# Patient Record
Sex: Male | Born: 1965 | Race: White | Hispanic: No | Marital: Married | Smoking: Never smoker
Health system: Southern US, Community
[De-identification: ages and names within clinical notes are randomized; demographics above are authoritative.]

## PROBLEM LIST (undated history)

## (undated) DIAGNOSIS — K432 Incisional hernia without obstruction or gangrene: Secondary | ICD-10-CM

## (undated) DIAGNOSIS — R319 Hematuria, unspecified: Secondary | ICD-10-CM

---

## 2011-01-11 ENCOUNTER — Emergency Department (HOSPITAL_COMMUNITY)
Admission: EM | Admit: 2011-01-11 | Discharge: 2011-01-11 | Discharge status: Against Medical Advice | Payer: PRIVATE HEALTH INSURANCE | Attending: Emergency Medicine | Admitting: Emergency Medicine

## 2011-01-11 DIAGNOSIS — I10 Essential (primary) hypertension: Secondary | ICD-10-CM | POA: Insufficient documentation

## 2011-01-11 DIAGNOSIS — IMO0002 Reserved for concepts with insufficient information to code with codable children: Secondary | ICD-10-CM | POA: Insufficient documentation

## 2011-01-11 DIAGNOSIS — F341 Dysthymic disorder: Secondary | ICD-10-CM | POA: Insufficient documentation

## 2011-01-11 DIAGNOSIS — Z79899 Other long term (current) drug therapy: Secondary | ICD-10-CM | POA: Insufficient documentation

## 2011-01-11 LAB — DIFFERENTIAL
Basophils Absolute: 0.1 10*3/uL (ref 0.0–0.1)
Basophils Relative: 1 % (ref 0–1)
Neutro Abs: 5.2 10*3/uL (ref 1.7–7.7)
Neutrophils Relative %: 63 % (ref 43–77)

## 2011-01-11 LAB — RAPID URINE DRUG SCREEN, HOSP PERFORMED
Amphetamines: NOT DETECTED
Benzodiazepines: NOT DETECTED
Tetrahydrocannabinol: NOT DETECTED

## 2011-01-11 LAB — CBC
Hemoglobin: 16.1 g/dL (ref 13.0–17.0)
MCHC: 34.3 g/dL (ref 30.0–36.0)
RBC: 5.31 MIL/uL (ref 4.22–5.81)
WBC: 8.3 10*3/uL (ref 4.0–10.5)

## 2011-01-11 LAB — BASIC METABOLIC PANEL
Chloride: 104 mEq/L (ref 96–112)
Creatinine, Ser: 1.16 mg/dL (ref 0.4–1.5)
GFR calc Af Amer: >60 mL/min (ref >60)
Potassium: 4.2 mEq/L (ref 3.5–5.1)

## 2011-01-11 LAB — ETHANOL: Alcohol, Ethyl (B): <5 mg/dL (ref 0–10)

## 2011-07-22 ENCOUNTER — Emergency Department (HOSPITAL_COMMUNITY)
Admission: EM | Admit: 2011-07-22 | Discharge: 2011-07-23 | Disposition: A | Discharge status: Home / Self Care | Payer: PRIVATE HEALTH INSURANCE | Attending: Emergency Medicine | Admitting: Emergency Medicine

## 2011-07-22 ENCOUNTER — Emergency Department (HOSPITAL_COMMUNITY): Payer: PRIVATE HEALTH INSURANCE

## 2011-07-22 DIAGNOSIS — S93409A Sprain of unspecified ligament of unspecified ankle, initial encounter: Secondary | ICD-10-CM | POA: Insufficient documentation

## 2011-07-22 DIAGNOSIS — M25579 Pain in unspecified ankle and joints of unspecified foot: Secondary | ICD-10-CM | POA: Insufficient documentation

## 2011-07-22 DIAGNOSIS — X500XXA Overexertion from strenuous movement or load, initial encounter: Secondary | ICD-10-CM | POA: Insufficient documentation

## 2011-07-22 DIAGNOSIS — Y9367 Activity, basketball: Secondary | ICD-10-CM | POA: Insufficient documentation

## 2011-07-22 DIAGNOSIS — I10 Essential (primary) hypertension: Secondary | ICD-10-CM | POA: Insufficient documentation

## 2011-07-24 ENCOUNTER — Other Ambulatory Visit: Payer: Self-pay | Admitting: Sports Medicine

## 2011-07-24 DIAGNOSIS — M25571 Pain in right ankle and joints of right foot: Secondary | ICD-10-CM

## 2011-07-25 ENCOUNTER — Other Ambulatory Visit: Payer: PRIVATE HEALTH INSURANCE

## 2012-01-29 ENCOUNTER — Ambulatory Visit (INDEPENDENT_AMBULATORY_CARE_PROVIDER_SITE_OTHER): Payer: PRIVATE HEALTH INSURANCE | Admitting: Family Medicine

## 2012-01-29 VITALS — BP 157/88 | HR 62 | Temp 97.7°F | Resp 16 | Ht 74.0 in | Wt 230.0 lb

## 2012-01-29 DIAGNOSIS — Z711 Person with feared health complaint in whom no diagnosis is made: Secondary | ICD-10-CM

## 2012-01-29 NOTE — Progress Notes (Signed)
Subjective: Patient is here for repeat STD testing. These were done and were negative one year ago. This is with regard to followup of an accident several years ago, and his wife wanted him rechecked. His wife had apparently had some symptoms which caused her concern. He denies any extramarital relationships, and has no reason to suspect any from his wife.  Objective Normal male external genitalia. Testes normal. No hernias.  Assessment: STD testing with no active disease concern.  Plan: Check HIV, RPR, ureteral, HSV. Check with me if he does not hear the results of your

## 2012-01-29 NOTE — Patient Instructions (Addendum)
I will plan to let you know the results. If you do not hear from [place] call back.

## 2012-01-31 LAB — GC/CHLAMYDIA PROBE AMP, URINE
Chlamydia, Swab/Urine, PCR: NEGATIVE
GC Probe Amp, Urine: NEGATIVE

## 2012-01-31 LAB — HSV(HERPES SIMPLEX VRS) I + II AB-IGG: HSV 2 Glycoprotein G Ab, IgG: 0.1 IV

## 2012-02-07 ENCOUNTER — Telehealth: Payer: Self-pay

## 2012-02-07 NOTE — Telephone Encounter (Signed)
Dr. Alwyn Ren, Please review labs and send to lab pool

## 2012-02-07 NOTE — Telephone Encounter (Signed)
GOT A CALL FROM LAB SAYING RESULTS WERE BACK,BUT HE WOULD LIKE TO KNOW WHAT THE RESULTS ARE.

## 2012-02-11 ENCOUNTER — Telehealth: Payer: Self-pay

## 2012-02-11 NOTE — Telephone Encounter (Signed)
Pt CB to ask about his lab results again. Requests CB asap after Dr Alwyn Ren reviews them.

## 2012-02-11 NOTE — Telephone Encounter (Signed)
I called patient back.  Tests negative

## 2014-02-18 NOTE — Progress Notes (Signed)
[facility] and nothing to indicate that Dr. Judie PetitMalcolm has seen patient.  Patient called in for annual f/u with [facility].  I returned patient call and advised to call me back.

## 2014-02-19 NOTE — Telephone Encounter (Signed)
Pt wanted to know what to do regarding his hematuria and hx kidney stone if Dr Judie PetitMalcolm wanted to order CT or [facility]. Given appointment and informed I'll check with Dr Judie PetitMalcolm on Tuesday.Charyl DancerJosephine Nykerria Macconnell

## 2014-02-22 ENCOUNTER — Encounter

## 2014-02-22 NOTE — Telephone Encounter (Signed)
Pt informed to have CT Urogram per Dr Judie PetitMalcolm and given the tel number of [facility] to schedule his Ct prior to appt. Faxed order to [phone].Charyl DancerJosephine Tavish Gettis

## 2014-03-01 ENCOUNTER — Encounter

## 2014-04-01 LAB — AMB POC URINALYSIS DIP STICK AUTO W/O MICRO
Bilirubin (UA POC): NEGATIVE
Blood (UA POC): NEGATIVE
Glucose (UA POC): NEGATIVE
Leukocyte esterase (UA POC): NEGATIVE
Nitrites (UA POC): NEGATIVE
Specific gravity (UA POC): 1.03 (ref 1.001–1.035)
Urobilinogen (UA POC): 0.2 (ref 0.2–1)
pH (UA POC): 5.5 (ref 4.6–8.0)

## 2014-04-01 MED ORDER — TAMSULOSIN SR 0.4 MG 24 HR CAP
0.4 mg | ORAL_CAPSULE | Freq: Every day | ORAL | Status: AC
Start: 2014-04-01 — End: 2014-05-01

## 2014-04-01 MED ORDER — DIAZEPAM 10 MG TAB
10 mg | ORAL_TABLET | ORAL | Status: DC
Start: 2014-04-01 — End: 2014-05-07

## 2014-04-01 MED ORDER — TRAMADOL 50 MG TAB
50 mg | ORAL_TABLET | Freq: Four times a day (QID) | ORAL | Status: DC | PRN
Start: 2014-04-01 — End: 2014-05-07

## 2014-04-01 NOTE — Progress Notes (Signed)
Faxed [facility] and gave patient script.

## 2014-04-01 NOTE — Progress Notes (Signed)
ASSESSMENT:     ICD-9-CM    1. Kidney stone 592.0 AMB POC URINALYSIS DIP STICK AUTO W/O MICRO   2. Hematuria 599.70 AMB POC URINALYSIS DIP STICK AUTO W/O MICRO      Bilateral kidney stones.  1 punctate stone right kidney.  7 or 8 small stones in left kidney.  Stones are small and dont require intervention but option for ESWL and URS was discussed.  ESWL would have minimal utility for his stone burden and URS likely overly aggressive.  Recommend focus on metabolic w/u and stone prevention and measures to assist with passage of stones.    Hematuria.  Likely related to stones but cannot r/o urothelial pathology.  CTU wnl.  Recommend cysto and cytology.          PLAN:    flomax and ultram PRN for stone passage.  Check [facility] and f/u to review.  Urine for cytology.  Cystoscopy at f/u appt.  Valium given for use prior to cysto if needed, will need a ride home if takes valium.               DISCUSSION:   Empiric Stone Prevention Recommendations were discussed:     A) Increase fluid consumption to make at least 2-2.5 litre of urine per day or consume at least 3 liters of fluid daily.   B) Decrease dietary salt intake (2 gm sodium daily) -- to lower urinary Calcium   C) Avoid dark drinks (ie, Coffee, Tea, Colas), nuts, chocolate -- to lower urinary Oxalate   D) Lemonade: 4 oz lemon juice mixed with 2 litre water, sweetened to taste -- to increase urinary Citrate      Chief Complaint   Patient presents with   ??? Kidney Stone   ??? Hematuria       HISTORY OF PRESENT ILLNESS:  James [name] is a 48 y.o. male who presents today for kidney stone and hematuria consultation and has been self referred.  He has a long hx of kidney stones, going back to medical school.  Passing stones with greater frequency over past year.  One recent trip to ER for renal colic.  Stone passed without intervention.    Over past year has also had intermittent gross hematuria.  Often with renal colic but occasionally painless.   Tries to hydrate  well but busy with OB/GYN practice so doesn't drink as much during day.  Father had RCC, treated with robotic PNx.  No other fhx of gu malignancy.  No smoking hx.        Review of Systems  Constitutional: Fever: No;  Skin: Rash: No;  HEENT: Hearing difficulty: No;  Eyes: Blurred vision: No;  Cardiovascular: Chest pain: No;  Respiratory: Shortness of breath: No;  Gastrointestinal: Nausea/vomiting: No;  Musculoskeletal: Back pain: No;  Neurological: Weakness: No;  Psychological: Memory loss: No;  Comments/additional findings:                 Past Medical History   Diagnosis Date   ??? Hematuria    ??? Kidney stone        Past Surgical History   Procedure Laterality Date   ??? Hx hernia repair  40 yrs ago   ??? Hx other surgical  2001     liver resection       History   Substance Use Topics   ??? Smoking status: Never Smoker    ??? Smokeless tobacco: Not on file   ??? Alcohol Use: Yes  Comment: occ       No Known Allergies    Family History   Problem Relation Age of Onset   ??? Cancer Father      kidney cancer   ??? Hypertension Father              PHYSICAL EXAMINATION:   BP 118/72   Ht 5\' 8"  (1.727 m)   Wt 158 lb (71.668 kg)   BMI 24.03 kg/m2  Constitutional: WDWN, Pleasant and appropriate affect, No acute distress.    CV:  No peripheral swelling noted  Respiratory: No respiratory distress or difficulties  Abdomen:  No abdominal masses or tenderness. No CVA tenderness. No inguinal hernias noted.  +UH, nontender, small.  Well healed mercedes benz scar from prior liver resection for transplant to daughter.  GU Male:    DRE: not indicated  SCROTUM:  No scrotal rash or lesions noticed.  Normal bilateral testes and epididymis.   PENIS: Urethral meatus normal in location and size. No urethral discharge.  Skin: No jaundice.    Neuro/Psych:  Alert and oriented x 3, affect appropriate.   Lymphatic:   No enlarged inguinal lymph nodes.        REVIEW OF LABS AND IMAGING:      Results for orders placed in visit on 04/01/14   AMB POC URINALYSIS  DIP STICK AUTO W/O MICRO       Result Value Ref Range    Color (UA POC) Yellow      Clarity (UA POC) Clear      Glucose (UA POC) Negative  Negative    Bilirubin (UA POC) Negative  Negative    Ketones (UA POC) 1+  Negative    Specific gravity (UA POC) 1.030  1.001 - 1.035    Blood (UA POC) Negative  Negative    pH (UA POC) 5.5  4.6 - 8.0    Protein (UA POC) 1+  Negative mg/dL    Urobilinogen (UA POC) 0.2 mg/dL  0.2 - 1    Nitrites (UA POC) Negative  Negative    Leukocyte esterase (UA POC) Negative  Negative     CT Urogram 02/28/14  Impression:    1. There are multiple nonobstructing renal stones bilaterally  particularly involving the lower pole of the left kidney. All of  these are intraparenchymal. No staghorn calculi. In addition,  there are multiple simple appearing renal cysts bilaterally.   Most of these are very small. Some cysts are difficult to  characterize due to their small size.    2. There is mild prominence of upper pole calyces involving the  right kidney. There is no definite hydronephrosis. There is  suggestion of mild stenosis involving the upper pole  infundibulum. I see no evidence of a mass.    3. Mild prostatic enlargement. The bladder is otherwise normal.  Ureters are normal in appearance.  4. 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 Venous  drainage from the left kidney also drains into the hemiazygos  vein and lumbar veins.    5. 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    6.. There is an anterior abdominal wall hernia in the epigastric  region containing fat without incarceration. Very small  umbilical hernia containing fat without incarceration.    Imaging Report Reviewed?  NO   Type:  CT scan  Images Reviewed?      YES      Type:   CT scan  Other Lab Data Reviewed?   YES    Urinalysis              Ruben ReasonJohn B. Gumaro Brightbill, MD   Assistant Professor   [facility] [employer] [facility]   [facility] [place] [place]

## 2014-04-06 LAB — CYTOLOGY NON GYN, UROLOGY OF ~~LOC~~ LAB

## 2014-04-08 NOTE — Progress Notes (Signed)
Quick Note:    Looks great  Fwd to pt    Arlis Everly B. Jasmyne Lodato, MD  Assistant Professor  [employer] Dept of Urology  [facility] [place]    ______

## 2014-05-06 NOTE — Telephone Encounter (Signed)
Called [facility] to request for result. Stated they send the collection kit on 03/31/14 but the pt never send it back.Harrie Jeans

## 2014-05-07 LAB — AMB POC URINALYSIS DIP STICK AUTO W/O MICRO
Bilirubin (UA POC): NEGATIVE
Blood (UA POC): NEGATIVE
Glucose (UA POC): NEGATIVE
Ketones (UA POC): NEGATIVE
Leukocyte esterase (UA POC): NEGATIVE
Nitrites (UA POC): NEGATIVE
Protein (UA POC): NEGATIVE mg/dL
Specific gravity (UA POC): 1.01 (ref 1.001–1.035)
Urobilinogen (UA POC): 0.2 (ref 0.2–1)
pH (UA POC): 7.5 (ref 4.6–8.0)

## 2014-05-07 NOTE — Progress Notes (Signed)
CYSTOSCOPY PROCEDURE    Patient Name: James SnowballStephen [name]            Date of Procedure: 05/07/2014     Chief Complaint   Patient presents with   ??? Hematuria   ??? Kidney Stone       ICD-9-CM    1. Hematuria 599.70 CYSTOURETHROSCOPY     AMB POC URINALYSIS DIP STICK AUTO W/O MICRO   2. Kidney stone 592.0 CYSTOURETHROSCOPY     AMB POC URINALYSIS DIP STICK AUTO W/O MICRO     Procedure: Cystoscopy    This procedure has been fully reviewed with the patient, and written informed consent has been obtained.    History:       Since last visit James SnowballStephen [name] reports intermittent gross hematuria, associated with renal colic.    BP 122/76   Ht 5\' 8"  (1.727 m)   Wt 158 lb (71.668 kg)   BMI 24.03 kg/m2    Exam:    Lidocaine Jelly: Yes            Scope: Flexible Scope  Meatus: Normal  Urethra: Normal  Prostate: non-obstructing  Bladder neck: open  Trigone: Normal  Trabeculation:normal  Diverticuli: No  Lesion: none    Lab / Imaging:   Results for orders placed in visit on 05/07/14   AMB POC URINALYSIS DIP STICK AUTO W/O MICRO       Result Value Ref Range    Color (UA POC)        Clarity (UA POC)        Glucose (UA POC) Negative  Negative    Bilirubin (UA POC) Negative  Negative    Ketones (UA POC) Negative  Negative    Specific gravity (UA POC) 1.010  1.001 - 1.035    Blood (UA POC) Negative  Negative    pH (UA POC) 7.5  4.6 - 8.0    Protein (UA POC) Negative  Negative mg/dL    Urobilinogen (UA POC) 0.2 mg/dL  0.2 - 1    Nitrites (UA POC) Negative  Negative    Leukocyte esterase (UA POC) Negative  Negative       Impression:  1. Normal cysto      Plan:  1. Proceed with metabolic work up   2. F/u to review [facility]       Antibiotic provided: Cipro    Ruben ReasonJohn B. Leslea Vowles, MD    Medical Documentation is provided with the assistance of Frederich Chaebbie Smith, medical scribe for Ruben ReasonJohn B Gilmar Bua, MD on 05/07/2014.

## 2016-04-16 ENCOUNTER — Inpatient Hospital Stay: Admit: 2016-04-16 | Payer: BLUE CROSS/BLUE SHIELD | Primary: Internal Medicine

## 2016-04-16 NOTE — Op Note (Deleted)
[facility]  Operation [facility]  NAME:  [name], James  SEX:   M  DATE: 04/16/2016  DOB: 22-Sep-1966  MR#    [id number]  ROOM:    ACCT#  [id number]        PREOPERATIVE DIAGNOSES:  1.  Incisional hernia.  2.  Umbilical hernia.    POSTOPERATIVE DIAGNOSIS:  1.  Incisional hernia.  2.  Umbilical hernia.    PROCEDURE PERFORMED:  1.  Repair of incisional hernia using a 4.3 cm Ventralex ST patch.  2.  Repair of umbilical hernia using Nurolon suture.    SURGEON:  Marlowe AltAlireza Glendoris Nodarse, MD.    ANESTHESIA:  General.    ESTIMATED BLOOD LOSS:  Minimal.    DESCRIPTION OF PROCEDURE:  The patient was brought into the operating room, placed in the supine position, received general anesthesia by the anesthesiology team.  His abdomen was prepped and draped in a sterile fashion.  We started with the umbilical hernia.  We made a curvilinear supraumbilical incision, as he had a hernia defect that protruded superiorly.  We encountered preperitoneal fat herniating through a small defect just above the umbilicus.  We were able to free up this herniation and reduce it back into the abdominal cavity.  The defect was extremely small, it measured approximately 4 mm.  In view of this, I did not use mesh at this location.  I was able to repair the defect with two separate interrupted 0 Nurolon sutures.  Hemostasis was complete.  The skin was closed with a running 4-0 Monocryl subcuticular suture.  He also had a second hernia.  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  He has a Chevron incision that is T'd off in the middle, and just to the left of the T he had a palpable bulge consistent with an incisional hernia.  I made a transverse incision of approximately 1.5 inches in length in the left limb of the Chevron incision just lateral to the T.  This was carried down through skin and subcutaneous tissues.  I encountered again preperitoneal fat herniating through a fascial defect.  The fascial defect  measured about 1 cm.  The fat was partly omentum, which would not reduce back into the abdominal cavity, so it was resected between 2-0 Vicryl LigaSures.  The stump was hemostatic and reduced back to the abdominal cavity.  Repair was carried out using a 4.3 cm Ventralex ST patch, which was placed within the abdominal cavity with the Seprafilm side down and the Marlex side up.  The fascia was closed over the patch with interrupted 0 Nurolon sutures.  I was also able to grab the strap of the patch with the middle suture to hold it in place and keep it from migrating.  The excess strap was cut off.  The repair was strong and tension free.  Hemostasis was complete.  Subcutaneous tissues were closed with 2-0 plain sutures.  The skin was closed with a running 4-0 Monocryl subcuticular suture.  Dermabond dressing was applied to both incisions.  The patient tolerated the procedure.      ___________________  Marlowe AltAlireza Gita Dilger MD  Dictated By:.   NS  D:04/20/2016 13:07:59  T: 04/20/2016 16:22:52  [id number]

## 2016-04-16 NOTE — Op Note (Signed)
[facility]  Operation Report  NAME:  [name], James  SEX:   M  DATE: 04/16/2016  DOB: 09/07/1966  MR#    [id number]  ROOM:    ACCT#  [id number]        PREOPERATIVE DIAGNOSES:  1.  Incisional hernia.  2.  Umbilical hernia.    POSTOPERATIVE DIAGNOSIS:  1.  Incisional hernia.  2.  Umbilical hernia.    PROCEDURE PERFORMED:  1.  Repair of incisional hernia using a 4.3 cm Ventralex ST patch.  2.  Repair of umbilical hernia using Nurolon suture.    SURGEON:  Toneisha Savary, MD.    ANESTHESIA:  General.    ESTIMATED BLOOD LOSS:  Minimal.    DESCRIPTION OF PROCEDURE:  The patient was brought into the operating room, placed in the supine position, received general anesthesia by the anesthesiology team.  His abdomen was prepped and draped in a sterile fashion.  We started with the umbilical hernia.  We made a curvilinear supraumbilical incision, as he had a hernia defect that protruded superiorly.  We encountered preperitoneal fat herniating through a small defect just above the umbilicus.  We were able to free up this herniation and reduce it back into the abdominal cavity.  The defect was extremely small, it measured approximately 4 mm.  In view of this, I did not use mesh at this location.  I was able to repair the defect with two separate interrupted 0 Nurolon sutures.  Hemostasis was complete.  The skin was closed with a running 4-0 Monocryl subcuticular suture.  He also had a second hernia.  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  He has a Chevron incision that is T'd off in the middle, and just to the left of the T he had a palpable bulge consistent with an incisional hernia.  I made a transverse incision of approximately 1.5 inches in length in the left limb of the Chevron incision just lateral to the T.  This was carried down through skin and subcutaneous tissues.  I encountered again preperitoneal fat herniating through a fascial defect.  The fascial defect  measured about 1 cm.  The fat was partly omentum, which would not reduce back into the abdominal cavity, so it was resected between 2-0 Vicryl LigaSures.  The stump was hemostatic and reduced back to the abdominal cavity.  Repair was carried out using a 4.3 cm Ventralex ST patch, which was placed within the abdominal cavity with the Seprafilm side down and the Marlex side up.  The fascia was closed over the patch with interrupted 0 Nurolon sutures.  I was also able to grab the strap of the patch with the middle suture to hold it in place and keep it from migrating.  The excess strap was cut off.  The repair was strong and tension free.  Hemostasis was complete.  Subcutaneous tissues were closed with 2-0 plain sutures.  The skin was closed with a running 4-0 Monocryl subcuticular suture.  Dermabond dressing was applied to both incisions.  The patient tolerated the procedure.      ___________________  Patrick Sohm MD  Dictated By:.   NS  D:04/20/2016 13:07:59  T: 04/20/2016 16:22:52  [id number]

## 2016-04-20 ENCOUNTER — Inpatient Hospital Stay: Payer: BLUE CROSS/BLUE SHIELD

## 2016-04-20 ENCOUNTER — Encounter: Primary: Internal Medicine

## 2016-04-20 LAB — MRSA SCREEN - PCR (NASAL): MRSA PCR SCREEN: NEGATIVE

## 2016-04-20 MED ORDER — OXYCODONE-ACETAMINOPHEN 5 MG-325 MG TAB
5-325 mg | ORAL_TABLET | ORAL | 0 refills | Status: AC | PRN
Start: 2016-04-20 — End: ?

## 2016-04-20 MED ORDER — ONDANSETRON (PF) 4 MG/2 ML INJECTION
4 mg/2 mL | Freq: Once | INTRAMUSCULAR | Status: DC | PRN
Start: 2016-04-20 — End: 2016-04-20

## 2016-04-20 MED ORDER — PROMETHAZINE IN NS 6.25 MG/50 ML IV PIGGY BAG
6.25 mg/50 ml | INTRAVENOUS | Status: DC | PRN
Start: 2016-04-20 — End: 2016-04-20

## 2016-04-20 MED ORDER — PROPOFOL 10 MG/ML IV EMUL
10 mg/mL | INTRAVENOUS | Status: DC | PRN
Start: 2016-04-20 — End: 2016-04-20
  Administered 2016-04-20: 16:00:00 via INTRAVENOUS

## 2016-04-20 MED ORDER — MIDAZOLAM 1 MG/ML IJ SOLN
1 mg/mL | INTRAMUSCULAR | Status: DC | PRN
Start: 2016-04-20 — End: 2016-04-20
  Administered 2016-04-20: 16:00:00 via INTRAVENOUS

## 2016-04-20 MED ORDER — BUPIVACAINE (PF) 0.25 % (2.5 MG/ML) IJ SOLN
0.25 % (2.5 mg/mL) | INTRAMUSCULAR | Status: DC | PRN
Start: 2016-04-20 — End: 2016-04-20
  Administered 2016-04-20: 16:00:00

## 2016-04-20 MED ORDER — ONDANSETRON (PF) 4 MG/2 ML INJECTION
4 mg/2 mL | INTRAMUSCULAR | Status: DC | PRN
Start: 2016-04-20 — End: 2016-04-20
  Administered 2016-04-20: 16:00:00 via INTRAVENOUS

## 2016-04-20 MED ORDER — MIDAZOLAM 1 MG/ML IJ SOLN
1 mg/mL | INTRAMUSCULAR | Status: AC
Start: 2016-04-20 — End: ?

## 2016-04-20 MED ORDER — LACTATED RINGERS IV
INTRAVENOUS | Status: DC | PRN
Start: 2016-04-20 — End: 2016-04-20
  Administered 2016-04-20: 16:00:00 via INTRAVENOUS

## 2016-04-20 MED ORDER — EPHEDRINE SULFATE 50 MG/ML IJ SOLN
50 mg/mL | INTRAMUSCULAR | Status: DC | PRN
Start: 2016-04-20 — End: 2016-04-20
  Administered 2016-04-20: 16:00:00 via INTRAVENOUS

## 2016-04-20 MED ORDER — CEFAZOLIN 1 GRAM SOLUTION FOR INJECTION
1 gram | INTRAMUSCULAR | Status: DC | PRN
Start: 2016-04-20 — End: 2016-04-20
  Administered 2016-04-20: 16:00:00 via INTRAVENOUS

## 2016-04-20 MED ORDER — FENTANYL CITRATE (PF) 50 MCG/ML IJ SOLN
50 mcg/mL | INTRAMUSCULAR | Status: DC | PRN
Start: 2016-04-20 — End: 2016-04-20
  Administered 2016-04-20 (×2): via INTRAVENOUS

## 2016-04-20 MED ORDER — EPHEDRINE SULFATE 50 MG/ML IJ SOLN
50 mg/mL | INTRAMUSCULAR | Status: AC
Start: 2016-04-20 — End: ?

## 2016-04-20 MED ORDER — DEXAMETHASONE SODIUM PHOSPHATE 4 MG/ML IJ SOLN
4 mg/mL | INTRAMUSCULAR | Status: DC | PRN
Start: 2016-04-20 — End: 2016-04-20
  Administered 2016-04-20: 16:00:00 via INTRAVENOUS

## 2016-04-20 MED ORDER — BUPIVACAINE (PF) 0.25 % (2.5 MG/ML) IJ SOLN
0.25 % (2.5 mg/mL) | INTRAMUSCULAR | Status: AC
Start: 2016-04-20 — End: ?

## 2016-04-20 MED ORDER — LIDOCAINE (PF) 10 MG/ML (1 %) IJ SOLN
10 mg/mL (1 %) | INTRAMUSCULAR | Status: DC | PRN
Start: 2016-04-20 — End: 2016-04-20
  Administered 2016-04-20: 16:00:00 via INTRAVENOUS

## 2016-04-20 MED ORDER — FENTANYL CITRATE (PF) 50 MCG/ML IJ SOLN
50 mcg/mL | INTRAMUSCULAR | Status: AC
Start: 2016-04-20 — End: ?

## 2016-04-20 MED ORDER — FENTANYL CITRATE (PF) 50 MCG/ML IJ SOLN
50 mcg/mL | INTRAMUSCULAR | Status: DC | PRN
Start: 2016-04-20 — End: 2016-04-20

## 2016-04-20 MED ORDER — GUM MASTIC-STORAX-METHYLSALICYLATE-ALCOHOL IN A DROPPERETTE
Status: AC
Start: 2016-04-20 — End: ?

## 2016-04-20 MED ORDER — OXYCODONE 5 MG TAB
5 mg | ORAL | Status: DC | PRN
Start: 2016-04-20 — End: 2016-04-20
  Administered 2016-04-20: 18:00:00 via ORAL

## 2016-04-20 MED ORDER — HYDROMORPHONE (PF) 1 MG/ML IJ SOLN
1 mg/mL | INTRAMUSCULAR | Status: DC | PRN
Start: 2016-04-20 — End: 2016-04-20

## 2016-04-20 MED FILL — LACTATED RINGERS IV: INTRAVENOUS | Qty: 1000

## 2016-04-20 MED FILL — OXYCODONE 5 MG TAB: 5 mg | ORAL | Qty: 1

## 2016-04-20 MED FILL — EPHEDRINE SULFATE 50 MG/ML IJ SOLN: 50 mg/mL | INTRAMUSCULAR | Qty: 10

## 2016-04-20 MED FILL — FENTANYL CITRATE (PF) 50 MCG/ML IJ SOLN: 50 mcg/mL | INTRAMUSCULAR | Qty: 2

## 2016-04-20 MED FILL — XYLOCAINE-MPF 10 MG/ML (1 %) INJECTION SOLUTION: 10 mg/mL (1 %) | INTRAMUSCULAR | Qty: 100

## 2016-04-20 MED FILL — MIDAZOLAM 1 MG/ML IJ SOLN: 1 mg/mL | INTRAMUSCULAR | Qty: 2

## 2016-04-20 MED FILL — DEXAMETHASONE SODIUM PHOSPHATE 4 MG/ML IJ SOLN: 4 mg/mL | INTRAMUSCULAR | Qty: 4

## 2016-04-20 MED FILL — EPHEDRINE SULFATE 50 MG/ML IJ SOLN: 50 mg/mL | INTRAMUSCULAR | Qty: 1

## 2016-04-20 MED FILL — GUM MASTIC-STORAX-METHYLSALICYLATE-ALCOHOL IN A DROPPERETTE: Qty: 1

## 2016-04-20 MED FILL — CEFAZOLIN 1 GRAM SOLUTION FOR INJECTION: 1 gram | INTRAMUSCULAR | Qty: 1000

## 2016-04-20 MED FILL — PROPOFOL 10 MG/ML IV EMUL: 10 mg/mL | INTRAVENOUS | Qty: 200

## 2016-04-20 MED FILL — BUPIVACAINE (PF) 0.25 % (2.5 MG/ML) IJ SOLN: 0.25 % (2.5 mg/mL) | INTRAMUSCULAR | Qty: 30

## 2016-04-20 MED FILL — ONDANSETRON (PF) 4 MG/2 ML INJECTION: 4 mg/2 mL | INTRAMUSCULAR | Qty: 4

## 2016-04-20 NOTE — Anesthesia Post-Procedure Evaluation (Signed)
Post-Anesthesia Evaluation and Assessment    Patient: James RansomStephen S Werk MRN: [id number]  SSN: [id number]    Date of Birth: 1966/11/02  Age: 50 y.o.  Sex: male       Cardiovascular Function/Vital Signs  Visit Vitals   ??? BP 121/86   ??? Pulse 98   ??? Temp 36.6 ??C (97.8 ??F)   ??? Resp 15   ??? Ht 5\' 8"  (1.727 m)   ??? Wt 73.6 kg (162 lb 4.1 oz)   ??? SpO2 99%   ??? BMI 24.67 kg/m2       Patient is status post general anesthesia for Procedure(s):  OPEN UMBILICAL AND INCISIONAL HERNIA  REPAIR WITH MESH.    Nausea/Vomiting: None    Postoperative hydration reviewed and adequate.    Pain:  Pain Scale 1: Numeric (0 - 10) (04/20/16 1330)  Pain Intensity 1: 3 (04/20/16 1330)   Managed    Neurological Status:   Neuro (WDL): Within Defined Limits (04/20/16 1330)   At baseline    Mental Status and Level of Consciousness: Arousable    Pulmonary Status:   O2 Device: Room air (04/20/16 1330)   Adequate oxygenation and airway patent    Complications related to anesthesia: None    Post-anesthesia assessment completed. No concerns    Signed By: A. Dan EuropeMorgan Dantae Meunier, MD     Apr 20, 2016

## 2016-04-20 NOTE — H&P (Signed)
Chief Complaint  Hernia    Medications  Reviewed Medications    Allergies  Reviewed Allergies  NKDA    Past Medical History  Reviewed Past Medical History  Notes: healthy    Surgical History  Reviewed Surgical History  liver resection for donation to daughter, bilat ing hernia    Family History  Reviewed Family History  Father - Hypertensive disorder    Social History  Reviewed Social History  Smoking Status: Never smoker.    Vitals  Visit Vitals   ??? BP (!) 129/92 (BP 1 Location: Right arm, BP Patient Position: Sitting)   ??? Pulse 85   ??? Temp 97.6 ??F (36.4 ??C)   ??? Resp 20   ??? Ht 5\' 8"  (1.727 m)   ??? Wt 73.6 kg (162 lb 4.1 oz)   ??? SpO2 100%   ??? 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 He is referred by Dr. Michael Litterasesh M. Shah. He is complaining mainly of a bulging in an incision at the top of his abdomen. He had done a liver donation for his daughter many years ago and where the Chevron incision crosses with a vertical incision, he has developed a bulge. He says that he can push it back in. It does not really bother him but it slowly has gotten larger and it constantly pops out. He thinks that he may also have an umbilical hernia. He has no GI symptoms.      Physical Exam  Patient is a 50 year old male.    Constitutional: General Appearance: healthy-appearing, well-nourished, and well-developed. Level of Distress: no acute distress. Ambulation: ambulating normally.    Head: Head: normocephalic and atraumatic.    Neck: Neck: supple, trachea midline, no masses, and full range of motion. Thyroid: no enlargement or nodules and non-tender. Lymph Nodes: no cervical LAD, supraclavicular LAD, or inguinal LAD.    Abdomen: Inspection and Palpation: no tenderness, guarding, masses, rebound tenderness, or CVA tenderness and soft and non-distended; He has a Chevron incision which is then T???d off superiorly. At the junction of  these incisions is a hernia defect. The defect is actually fairly small, about a centimeter. The protruding part is a little bit larger but it reduces. He has a small umbilical component of a hernia which is about a 1 cm defect.. Liver: non-tender and no hepatomegaly. Spleen: non-tender and no splenomegaly.    Assessment / Plan  1. Incisional hernia.   2. Umbilical hernia. Plan for repair. He is going to call me when he is ready. We talked about using mesh. He understands this may increase the infection rates but should decrease recurrence rates. He agrees to proceed.    1. Incisional hernia  553.21: Incisional hernia without mention of obstruction or gangrene    2. Umbilical hernia  553.1: Umbilical hernia without mention of obstruction or gangrene  HERNIA: AFTER YOUR VISIT

## 2016-04-20 NOTE — Anesthesia Pre-Procedure Evaluation (Addendum)
Anesthetic History   No history of anesthetic complications            Review of Systems / Medical History  Patient summary reviewed, nursing notes reviewed and pertinent labs reviewed    Pulmonary  Within defined limits                 Neuro/Psych   Within defined limits           Cardiovascular  Within defined limits                Exercise tolerance: >4 METS     GI/Hepatic/Renal  Within defined limits              Endo/Other  Within defined limits           Other Findings              Physical Exam    Airway  Mallampati: II  TM Distance: 4 - 6 cm  Neck ROM: normal range of motion   Mouth opening: Normal     Cardiovascular  Regular rate and rhythm,  S1 and S2 normal,  no murmur, click, rub, or gallop             Dental  No notable dental hx       Pulmonary  Breath sounds clear to auscultation               Abdominal  GI exam deferred       Other Findings            Anesthetic Plan    ASA: 1  Anesthesia type: general          Induction: Intravenous  Anesthetic plan and risks discussed with: Patient      Discussed the anesthesiology plan and consent form including common and serious adverse events in detail.  QA to patient satisfaction.

## 2016-04-20 NOTE — Op Note (Signed)
[facility]  [facility]  NAME:  [name], James  SEX:   M  DATE: 04/16/2016  DOB: 1966-07-22  MR#    [id number]  ROOM:    ACCT#  [id number]        PREOPERATIVE DIAGNOSES:  1.  Incisional hernia.  2.  Umbilical hernia.    POSTOPERATIVE DIAGNOSIS:  1.  Incisional hernia.  2.  Umbilical hernia.    PROCEDURE PERFORMED:  1.  Repair of incisional hernia using a 4.3 cm Ventralex ST patch.  2.  Repair of umbilical hernia using Nurolon suture.    SURGEON:  Marlowe AltAlireza Emilio Baylock, MD.    ANESTHESIA:  General.    ESTIMATED BLOOD LOSS:  Minimal.    DESCRIPTION OF PROCEDURE:  The patient was brought into the operating room, placed in the supine position, received general anesthesia by the anesthesiology team.  His abdomen was prepped and draped in a sterile fashion.  We started with the umbilical hernia.  We made a curvilinear supraumbilical incision, as he had a hernia defect that protruded superiorly.  We encountered preperitoneal fat herniating through a small defect just above the umbilicus.  We were able to free up this herniation and reduce it back into the abdominal cavity.  The defect was extremely small, it measured approximately 4 mm.  In view of this, I did not use mesh at this location.  I was able to repair the defect with two separate interrupted 0 Nurolon sutures.  Hemostasis was complete.  The skin was closed with a running 4-0 Monocryl subcuticular suture.  He also had a second hernia.  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  He has a Chev[employer] incision that is T'd off in the middle, and just to the left of the T he had a palpable bulge consistent with an incisional hernia.  I made a transverse incision of approximately 1.5 inches in length in the left limb of the Chevron incision just lateral to the T.  This was carried down through skin and subcutaneous tissues.  I encountered again preperitoneal fat herniating through a fascial defect.  The fascial defect measured  about 1 cm.  The fat was partly omentum, which would not reduce back into the abdominal cavity, so it was resected between 2-0 Vicryl LigaSures.  The stump was hemostatic and reduced back to the abdominal cavity.  Repair was carried out using a 4.3 cm Ventralex ST patch, which was placed within the abdominal cavity with the Seprafilm side down and the Marlex side up.  The fascia was closed over the patch with interrupted 0 Nurolon sutures.  I was also able to grab the strap of the patch with the middle suture to hold it in place and keep it from migrating.  The excess strap was cut off.  The repair was strong and tension free.  Hemostasis was complete.  Subcutaneous tissues were closed with 2-0 plain sutures.  The skin was closed with a running 4-0 Monocryl subcuticular suture.  Dermabond dressing was applied to both incisions.  The patient tolerated the procedure.      ___________________  Marlowe AltAlireza Daiki Dicostanzo MD  Dictated By:.   NS  D:04/20/2016 13:07:59  T: 04/20/2016 16:22:52  [id number]

## 2016-08-28 ENCOUNTER — Ambulatory Visit (INDEPENDENT_AMBULATORY_CARE_PROVIDER_SITE_OTHER): Payer: 59 | Admitting: Orthopaedic Surgery

## 2016-08-28 DIAGNOSIS — M25561 Pain in right knee: Secondary | ICD-10-CM

## 2016-08-29 ENCOUNTER — Ambulatory Visit (INDEPENDENT_AMBULATORY_CARE_PROVIDER_SITE_OTHER): Payer: PRIVATE HEALTH INSURANCE | Admitting: Orthopaedic Surgery

## 2016-08-30 ENCOUNTER — Ambulatory Visit (INDEPENDENT_AMBULATORY_CARE_PROVIDER_SITE_OTHER): Payer: PRIVATE HEALTH INSURANCE | Admitting: Orthopedic Surgery

## 2016-08-30 ENCOUNTER — Other Ambulatory Visit (INDEPENDENT_AMBULATORY_CARE_PROVIDER_SITE_OTHER): Payer: Self-pay | Admitting: Orthopaedic Surgery

## 2016-08-30 DIAGNOSIS — M25561 Pain in right knee: Secondary | ICD-10-CM

## 2016-09-01 ENCOUNTER — Ambulatory Visit
Admission: RE | Admit: 2016-09-01 | Discharge: 2016-09-01 | Disposition: A | Discharge status: Home / Self Care | Payer: PRIVATE HEALTH INSURANCE | Source: Ambulatory Visit | Attending: Orthopaedic Surgery | Admitting: Orthopaedic Surgery

## 2016-09-01 DIAGNOSIS — M25561 Pain in right knee: Secondary | ICD-10-CM

## 2016-09-13 DIAGNOSIS — M25861 Other specified joint disorders, right knee: Secondary | ICD-10-CM | POA: Diagnosis not present

## 2016-09-20 ENCOUNTER — Ambulatory Visit (INDEPENDENT_AMBULATORY_CARE_PROVIDER_SITE_OTHER): Payer: 59 | Admitting: Physician Assistant

## 2016-09-20 ENCOUNTER — Encounter (INDEPENDENT_AMBULATORY_CARE_PROVIDER_SITE_OTHER): Payer: Self-pay | Admitting: Physician Assistant

## 2016-09-20 DIAGNOSIS — M94261 Chondromalacia, right knee: Secondary | ICD-10-CM

## 2016-09-20 NOTE — Progress Notes (Signed)
   Post-Op Visit Note   Patient: Zachary SnowballStephen Williams           Date of Birth: 12/19/1965           MRN: [id number] Visit Date: 09/20/2016 PCP: Pearla DubonnetGATES,ROBERT NEVILL, MD  Patient comes in for 1st postop appointment s/p right knee scope, limited synovectomy and debridement on 09/13/16.   He is doing very well, no pain or swelling, increased ROM now.  He only took pain meds x 1 day postop.  Knee doing well better than pre-op.  Right Knee Exam  Swelling: Mild Effusion: No  Range of Motion  Extension: -5 Flexion:     110  Comments:  Port sites benign. Calf supple non tender. Ambulates without assistive devices.     Assessment & Plan:  Chief Complaint:  Chief Complaint  Patient presents with  . Right Knee - Routine Post Op   Visit Diagnoses:  1. Chondromalacia, right knee     Plan: Quad strengthening   Follow-Up Instructions: Return in about 4 weeks (around 10/18/2016).   Orders:  No orders of the defined types were placed in this encounter.  No orders of the defined types were placed in this encounter.    PMFS History: There are no active problems to display for this patient.  No past medical history on file.  No family history on file.  No past surgical history on file. Social History   Occupational History  . Not on file.   Social History Main Topics  . Smoking status: Never Smoker  . Smokeless tobacco: Not on file  . Alcohol use Not on file  . Drug use: Unknown  . Sexual activity: Not on file

## 2016-10-22 ENCOUNTER — Ambulatory Visit (INDEPENDENT_AMBULATORY_CARE_PROVIDER_SITE_OTHER): Payer: 59 | Admitting: Orthopaedic Surgery

## 2016-10-22 DIAGNOSIS — Z9889 Other specified postprocedural states: Secondary | ICD-10-CM

## 2016-10-22 NOTE — Progress Notes (Signed)
Mr. Zachary Williams is continue to make improvements following up status post a right knee arthroscopy. He is back to playing basketball and some golf still hurts him which it should at 6 weeks postoperative but these doing better overall. He says that the throbbing pain at night his subsided.  On examination of his right knee are still does a lot effusion comparing the right knee which is operative knee to left knee but overall looks good is no evidence infection. I still can't fully extend his knee like the other side but overall is quadrant is improving as well as his Strength.  At this point he'll continue increase his activities as tolerated. He can follow up as needed. I have told him that between now and mid January if he is having any issues we can always provide an injection in his knee if needed. He knows always stretch before is playing sports as well.

## 2017-09-04 IMAGING — MR MR KNEE*R* W/O CM
4 of 6 series · 19 of 40 positions shown · non-contrast
Comparison: None.

CLINICAL DATA: Injured knee playing basketball 9 days ago.
Persistent pain.

EXAM:
MRI OF THE RIGHT KNEE WITHOUT CONTRAST
TECHNIQUE: Multiplanar, multisequence MR imaging of the knee was performed. No
intravenous contrast was administered.

[Series 3: PD fat-sat · axial · 4.0mm · 0.31mm/px · z∈[-79,+49]mm · 7 of 30 slices shown (1 of 4)]
[im 1/30]
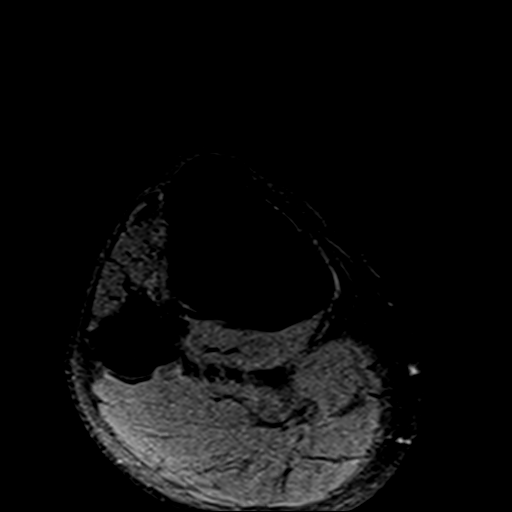
[im 5/30]
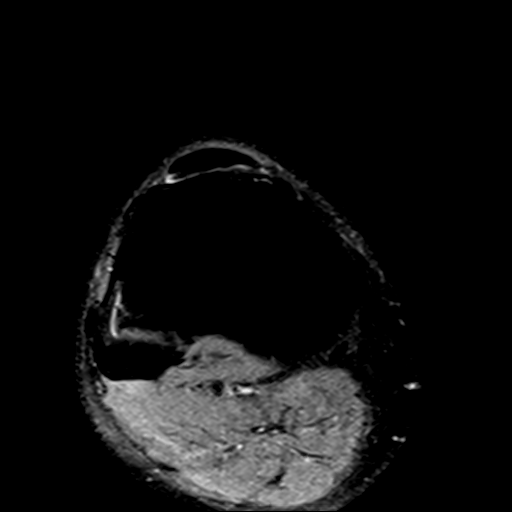
[im 10/30]
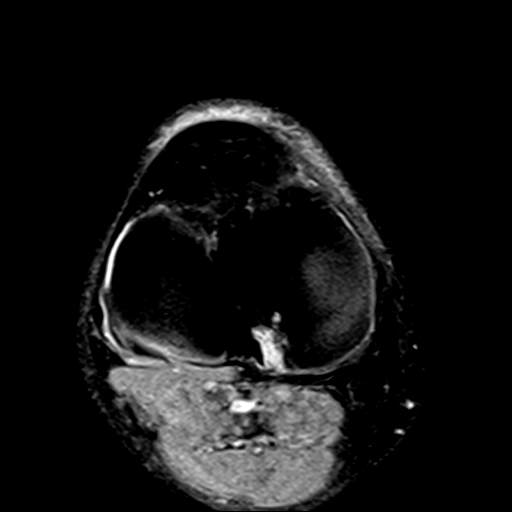
[im 15/30]
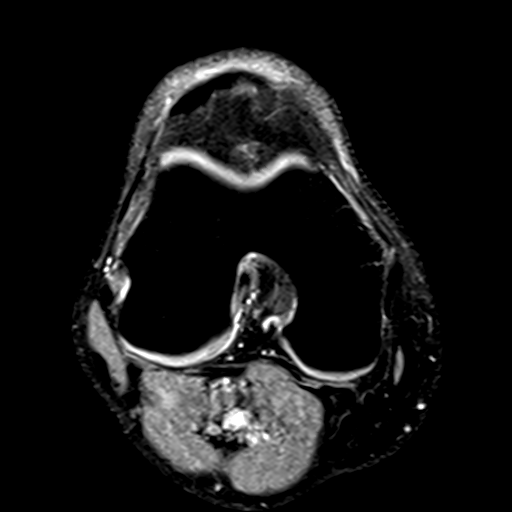
[im 20/30]
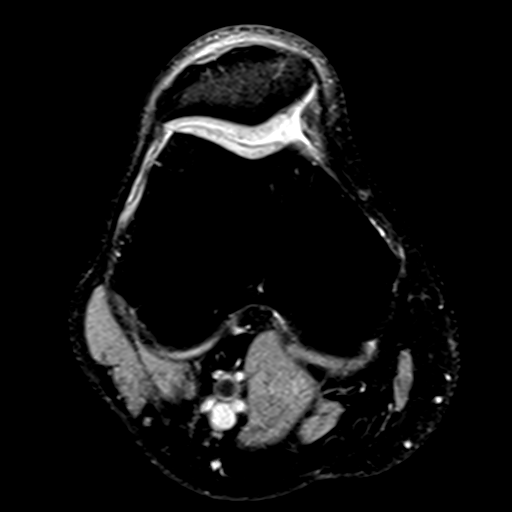
[im 25/30]
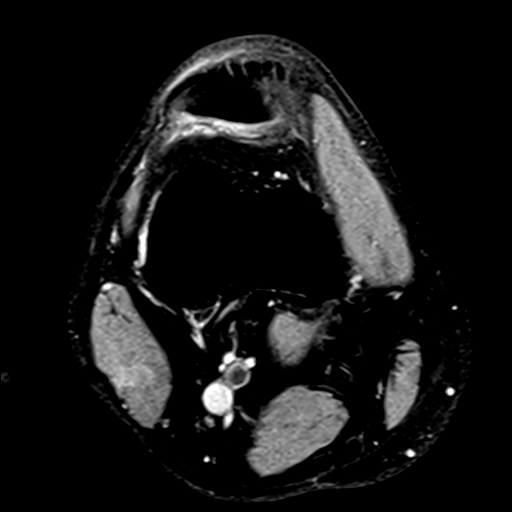
[im 30/30]
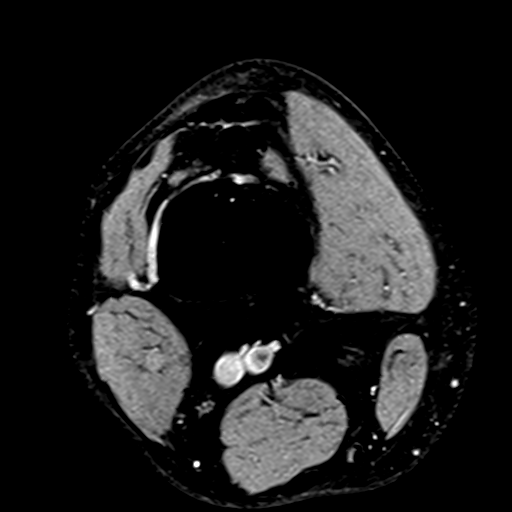

[Series 6: PD fat-sat · coronal · 3.5mm · 0.31mm/px · 6 of 30 slices shown (2 of 4)]
[im 1/30]
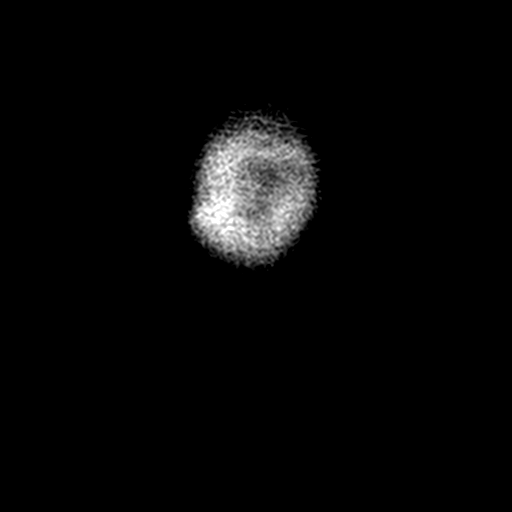
[im 5/30]
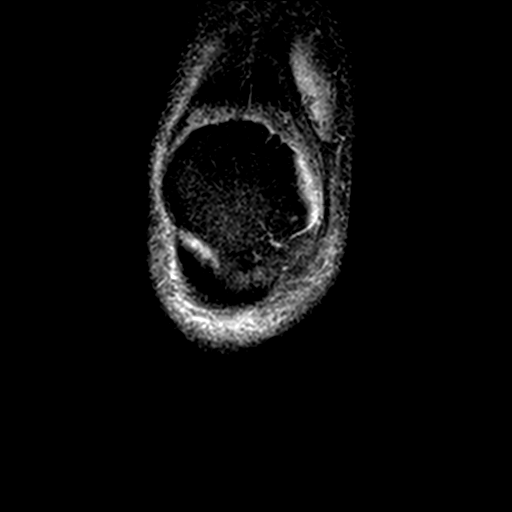
[im 9/30]
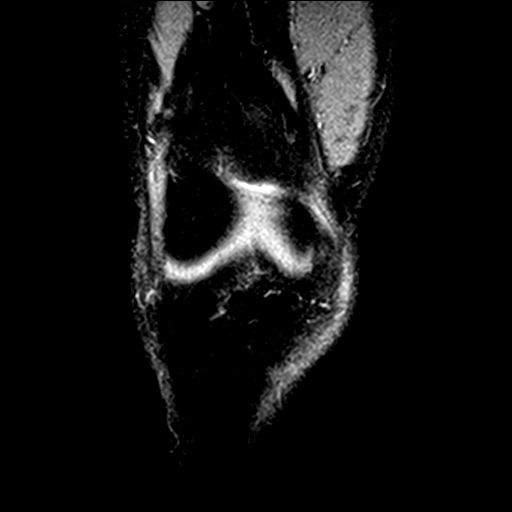
[im 13/30]
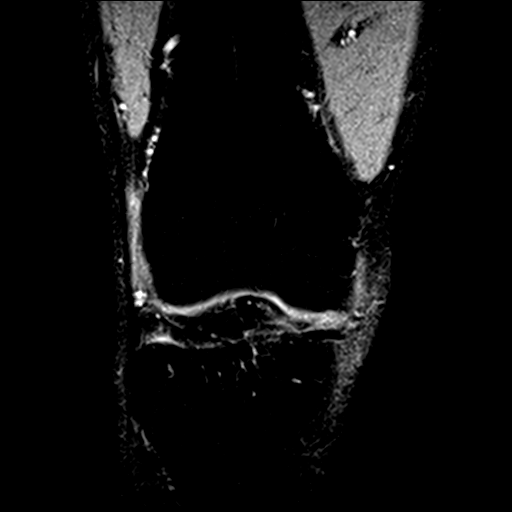
[im 17/30]
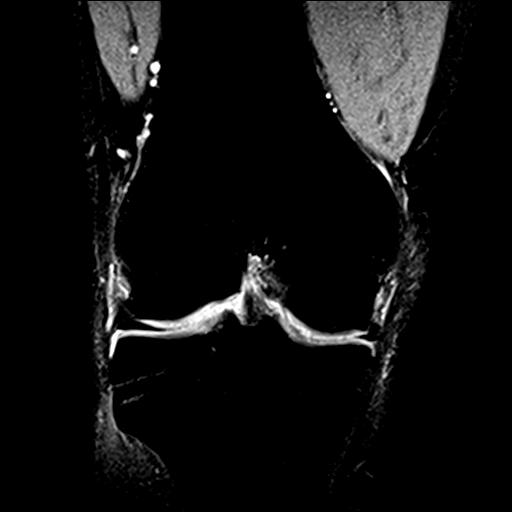
[im 25/30]
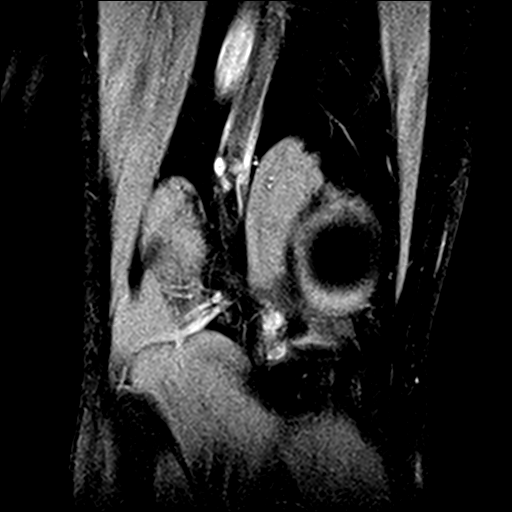

[Series 7: PD fat-sat · sagittal · 3.5mm · 0.31mm/px · 3 of 25 slices shown (3 of 4)]
[im 5/25]
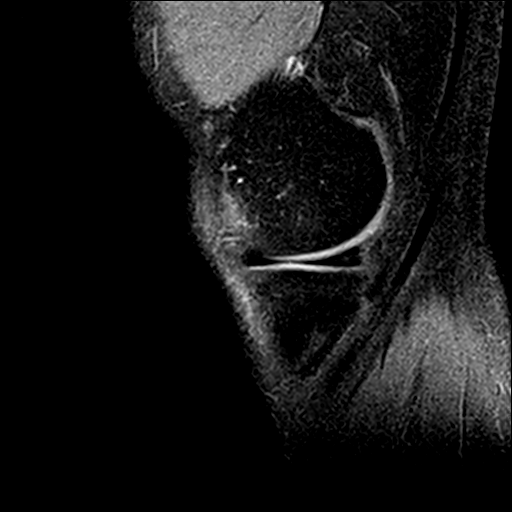
[im 15/25]
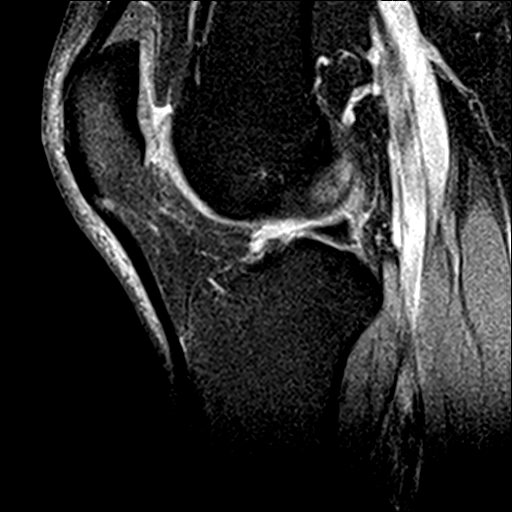
[im 25/25]
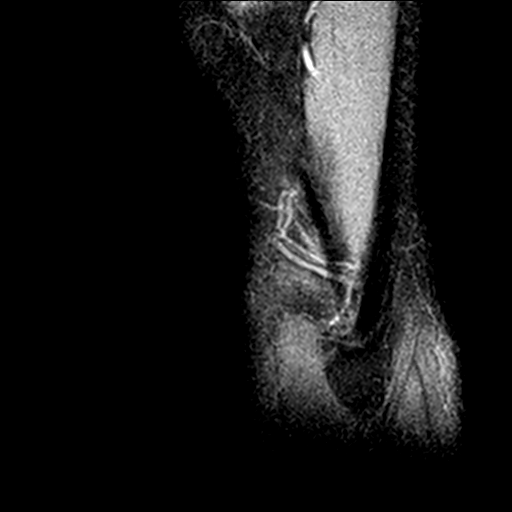

[Series 8: PD fat-sat · coronal · 2.0mm · 0.29mm/px · 3 of 11 slices shown (4 of 4)]
[im 1/11]
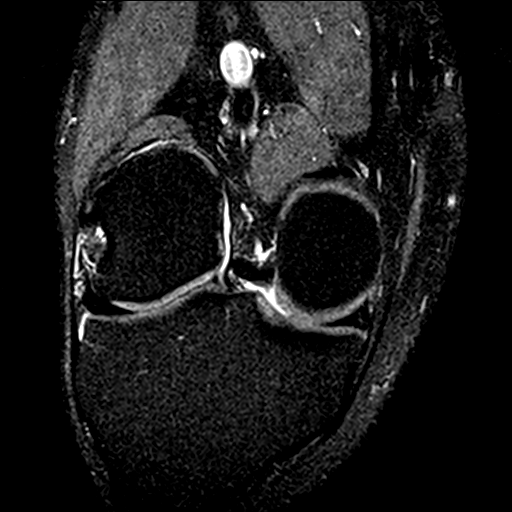
[im 6/11]
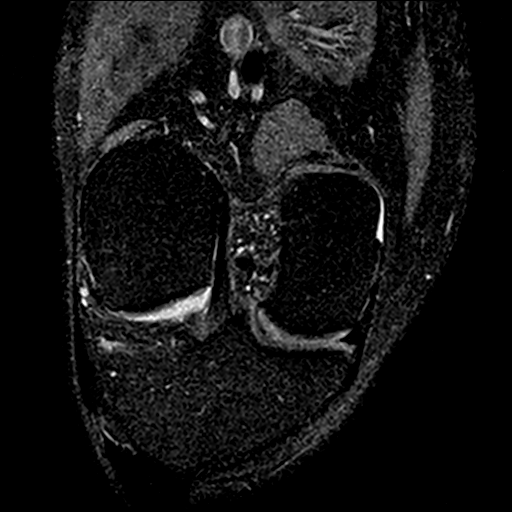
[im 11/11]
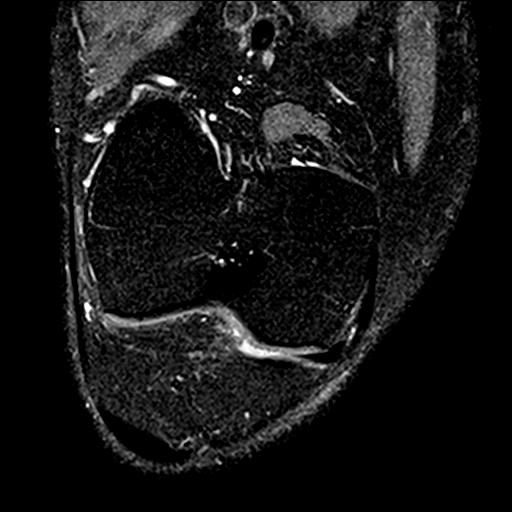

[19 of 40 positions shown; findings below may reference images not displayed]

FINDINGS: MENISCI

Medial meniscus:  Intact

Lateral meniscus:  Intact

LIGAMENTS

Cruciates:  Intact

Collaterals:  Intact

CARTILAGE

Patellofemoral: Mild degenerative chondrosis/ chondromalacia mainly
along the medial facet.

Medial:  Normal

Lateral:  Normal

Joint: No joint effusion. There is thickening and inflammation of
the quadriceps fat pad.

Popliteal Fossa:  No popliteal mass or Baker's cyst.

Extensor Mechanism: The patella retinacular structures are intact
and the quadriceps and patellar tendons are intact. Mild proximal
patellar tendinopathy.

Bones: No acute bony findings. No bone contusion, marrow edema or
osteochondral abnormality.

Other: The knee musculature appears normal.
IMPRESSION: 1. Intact ligamentous structures and no acute bony findings.
2. No meniscal tears.
3. Chondromalacia patella.
4. Thickening and inflammation of the quadriceps fat pad.
5. No joint effusion or Baker's cyst.
6. Mild proximal patellar tendinopathy.

## 2020-03-28 ENCOUNTER — Ambulatory Visit
Admission: RE | Admit: 2020-03-28 | Discharge: 2020-03-28 | Disposition: A | Discharge status: Home / Self Care | Payer: 59 | Source: Ambulatory Visit | Attending: Internal Medicine | Admitting: Internal Medicine

## 2020-03-28 ENCOUNTER — Other Ambulatory Visit: Payer: Self-pay | Admitting: Internal Medicine

## 2020-03-28 DIAGNOSIS — M25559 Pain in unspecified hip: Secondary | ICD-10-CM

## 2020-12-22 ENCOUNTER — Ambulatory Visit: Payer: 59
